# Patient Record
Sex: Male | Born: 1975 | Race: Black or African American | Hispanic: No | Marital: Single | Smoking: Current some day smoker
Health system: Southern US, Community
[De-identification: ages and names within clinical notes are randomized; demographics above are authoritative.]

## PROBLEM LIST (undated history)

## (undated) HISTORY — PX: KNEE SURGERY: SHX244

## (undated) HISTORY — PX: ANKLE SURGERY: SHX546

## (undated) HISTORY — PX: EYE SURGERY: SHX253

## (undated) HISTORY — PX: HIP SURGERY: SHX245

---

## 2017-11-14 ENCOUNTER — Other Ambulatory Visit: Payer: Self-pay

## 2017-11-14 ENCOUNTER — Emergency Department (HOSPITAL_COMMUNITY): Payer: Self-pay

## 2017-11-14 ENCOUNTER — Encounter (HOSPITAL_COMMUNITY): Payer: Self-pay | Admitting: Emergency Medicine

## 2017-11-14 ENCOUNTER — Emergency Department (HOSPITAL_COMMUNITY)
Admission: EM | Admit: 2017-11-14 | Discharge: 2017-11-14 | Disposition: A | Discharge status: Home / Self Care | Payer: Self-pay | Attending: Emergency Medicine | Admitting: Emergency Medicine

## 2017-11-14 DIAGNOSIS — F17210 Nicotine dependence, cigarettes, uncomplicated: Secondary | ICD-10-CM | POA: Insufficient documentation

## 2017-11-14 DIAGNOSIS — Y929 Unspecified place or not applicable: Secondary | ICD-10-CM | POA: Insufficient documentation

## 2017-11-14 DIAGNOSIS — Y9389 Activity, other specified: Secondary | ICD-10-CM | POA: Insufficient documentation

## 2017-11-14 DIAGNOSIS — Z23 Encounter for immunization: Secondary | ICD-10-CM | POA: Insufficient documentation

## 2017-11-14 DIAGNOSIS — S60221A Contusion of right hand, initial encounter: Secondary | ICD-10-CM | POA: Insufficient documentation

## 2017-11-14 DIAGNOSIS — W231XXA Caught, crushed, jammed, or pinched between stationary objects, initial encounter: Secondary | ICD-10-CM | POA: Insufficient documentation

## 2017-11-14 DIAGNOSIS — Z634 Disappearance and death of family member: Secondary | ICD-10-CM | POA: Insufficient documentation

## 2017-11-14 DIAGNOSIS — Y999 Unspecified external cause status: Secondary | ICD-10-CM | POA: Insufficient documentation

## 2017-11-14 DIAGNOSIS — S60511A Abrasion of right hand, initial encounter: Secondary | ICD-10-CM | POA: Insufficient documentation

## 2017-11-14 MED ORDER — TETANUS-DIPHTH-ACELL PERTUSSIS 5-2.5-18.5 LF-MCG/0.5 IM SUSP
INTRAMUSCULAR | Status: AC
  Administered 2017-11-14: 0.5 mL via INTRAMUSCULAR
  Filled 2017-11-14: qty 0.5

## 2017-11-14 MED ORDER — HYDROCODONE-ACETAMINOPHEN 5-325 MG PO TABS
2.0000 | ORAL_TABLET | Freq: Once | ORAL | Status: AC
  Administered 2017-11-14: 2 via ORAL
  Filled 2017-11-14: qty 2

## 2017-11-14 MED ORDER — LORAZEPAM 0.5 MG PO TABS: ORAL_TABLET | ORAL | 0 refills | Status: AC

## 2017-11-14 MED ORDER — ONDANSETRON HCL 4 MG PO TABS
4.0000 mg | ORAL_TABLET | Freq: Once | ORAL | Status: AC
  Administered 2017-11-14: 4 mg via ORAL
  Filled 2017-11-14: qty 1

## 2017-11-14 MED ORDER — LORAZEPAM 1 MG PO TABS
1.0000 mg | ORAL_TABLET | Freq: Once | ORAL | Status: AC
  Administered 2017-11-14: 1 mg via ORAL
  Filled 2017-11-14: qty 1

## 2017-11-14 MED ORDER — BACITRACIN-NEOMYCIN-POLYMYXIN 400-5-5000 EX OINT
TOPICAL_OINTMENT | Freq: Once | CUTANEOUS | Status: DC
  Filled 2017-11-14: qty 1

## 2017-11-14 MED ORDER — HYDROCODONE-ACETAMINOPHEN 5-325 MG PO TABS: 1.0000 | ORAL_TABLET | ORAL | 0 refills | Status: AC | PRN

## 2017-11-14 NOTE — Discharge Instructions (Addendum)
Your x-ray is negative for fracture or dislocation.  The wound to your hand was cleansed and dressing applied.  Please change the dressing daily until the wound is healed.  Please keep your hand elevated above your heart is much as possible, as this will improve swelling and decreased so much of the pain.  Please use 600 mg of ibuprofen with breakfast, lunch, dinner, and at bedtime.  Use Norco every 4 hours as needed for more severe pain.  May use Ativan 3 times daily as needed for anxiety.  Please return to the emergency department if any signs of advancing infection in the hand, problems, or concerns.
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
## 2021-06-17 ENCOUNTER — Other Ambulatory Visit: Payer: Self-pay

## 2021-06-17 ENCOUNTER — Encounter (HOSPITAL_COMMUNITY): Payer: Self-pay | Admitting: Emergency Medicine

## 2021-06-17 ENCOUNTER — Emergency Department (HOSPITAL_COMMUNITY): Payer: No Typology Code available for payment source

## 2021-06-17 ENCOUNTER — Emergency Department (HOSPITAL_COMMUNITY)
Admission: EM | Admit: 2021-06-17 | Discharge: 2021-06-17 | Disposition: A | Discharge status: Home / Self Care | Payer: No Typology Code available for payment source | Attending: Student | Admitting: Student

## 2021-06-17 DIAGNOSIS — M545 Low back pain, unspecified: Secondary | ICD-10-CM | POA: Diagnosis not present

## 2021-06-17 DIAGNOSIS — R110 Nausea: Secondary | ICD-10-CM | POA: Insufficient documentation

## 2021-06-17 DIAGNOSIS — R42 Dizziness and giddiness: Secondary | ICD-10-CM | POA: Insufficient documentation

## 2021-06-17 DIAGNOSIS — R519 Headache, unspecified: Secondary | ICD-10-CM | POA: Insufficient documentation

## 2021-06-17 DIAGNOSIS — Y92410 Unspecified street and highway as the place of occurrence of the external cause: Secondary | ICD-10-CM | POA: Insufficient documentation

## 2021-06-17 MED ORDER — ACETAMINOPHEN 325 MG PO TABS
650.0000 mg | ORAL_TABLET | Freq: Once | ORAL | Status: AC
  Administered 2021-06-17: 650 mg via ORAL
  Filled 2021-06-17: qty 2

## 2021-06-17 MED ORDER — CYCLOBENZAPRINE HCL 10 MG PO TABS: 10.0000 mg | ORAL_TABLET | Freq: Two times a day (BID) | ORAL | 0 refills | Status: AC | PRN

## 2021-06-17 NOTE — Discharge Instructions (Addendum)
Imaging was reassuring, it is possible that you have a slight concussion, symptoms include a mild headache, brain fog, dizziness, lightheadedness, difficulty concentrating, increase sensitivity to light or noise.  The symptoms will resolve on their own I recommend brain rest i.e. decreasing screen time, vigorous activities and slowly reintroduce them as tolerated.  If your symptoms persist over the weeks time please follow-up with your PCP and/or the concussion clinic for further evaluation you may take over-the-counter pain medication as needed. ? ?I have given you a muscle laxer for muscle tenderness please take as prescribed. ? ?Come back to the emergency department if you develop chest pain, shortness of breath, severe abdominal pain, uncontrolled nausea, vomiting, diarrhea. ? ? ?
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
